# Patient Record
Sex: Female | Born: 1975 | Race: Black or African American | Hispanic: No | Marital: Single | Smoking: Never smoker
Health system: Southern US, Community
[De-identification: ages and names within clinical notes are randomized; demographics above are authoritative.]

## PROBLEM LIST (undated history)

## (undated) DIAGNOSIS — G43909 Migraine, unspecified, not intractable, without status migrainosus: Secondary | ICD-10-CM

## (undated) DIAGNOSIS — R42 Dizziness and giddiness: Secondary | ICD-10-CM

---

## 2016-01-19 ENCOUNTER — Emergency Department
Admission: EM | Admit: 2016-01-19 | Discharge: 2016-01-19 | Disposition: A | Discharge status: Home / Self Care | Payer: Worker's Compensation | Attending: Student | Admitting: Student

## 2016-01-19 ENCOUNTER — Encounter: Payer: Self-pay | Admitting: Emergency Medicine

## 2016-01-19 DIAGNOSIS — X58XXXA Exposure to other specified factors, initial encounter: Secondary | ICD-10-CM | POA: Insufficient documentation

## 2016-01-19 DIAGNOSIS — M545 Low back pain: Secondary | ICD-10-CM | POA: Diagnosis present

## 2016-01-19 DIAGNOSIS — Y990 Civilian activity done for income or pay: Secondary | ICD-10-CM | POA: Diagnosis not present

## 2016-01-19 DIAGNOSIS — Y9389 Activity, other specified: Secondary | ICD-10-CM | POA: Diagnosis not present

## 2016-01-19 DIAGNOSIS — S39012A Strain of muscle, fascia and tendon of lower back, initial encounter: Secondary | ICD-10-CM | POA: Diagnosis not present

## 2016-01-19 DIAGNOSIS — Y9289 Other specified places as the place of occurrence of the external cause: Secondary | ICD-10-CM | POA: Insufficient documentation

## 2016-01-19 MED ORDER — NAPROXEN 500 MG PO TABS: 500.0000 mg | ORAL_TABLET | Freq: Two times a day (BID) | ORAL | Status: DC

## 2016-01-19 MED ORDER — METHOCARBAMOL 500 MG PO TABS: 500.0000 mg | ORAL_TABLET | Freq: Four times a day (QID) | ORAL | Status: AC

## 2016-01-19 NOTE — ED Provider Notes (Signed)
[facility] Emergency Department Provider Note  ____________________________________________  Time seen: Approximately 5:28 PM  I have reviewed the triage vital signs and the nursing notes.   HISTORY  Chief Complaint Back Pain    HPI Holly GrillLisa Elizabeth Mason is a 40 y.o. female who presents emergency department complaining of right lower back pain 8 hours. Patient states that she was at work moving a very obese patient when she felt pain pulled/straining sensation in her lower back. Patient states that she has had constant pain in this region since this occurrence. She denies any radiation to midline, left side, her lower extremities. Patient states that the pain is sharp, constant, 8 out of 10. She is tried Tylenol 1 with only mild relief. No other injury or complaint at this time.   History reviewed. No pertinent past medical history.  There are no active problems to display for this patient.   History reviewed. No pertinent past surgical history.  Current Outpatient Rx  Name  Route  Sig  Dispense  Refill  . methocarbamol (ROBAXIN) 500 MG tablet   Oral   Take 1 tablet (500 mg total) by mouth 4 (four) times daily.   16 tablet   0   . naproxen (NAPROSYN) 500 MG tablet   Oral   Take 1 tablet (500 mg total) by mouth 2 (two) times daily with a meal.   60 tablet   0     Allergies Review of patient's allergies indicates no known allergies.  No family history on file.  Social History Social History  Substance Use Topics  . Smoking status: Never Smoker   . Smokeless tobacco: None  . Alcohol Use: No     Review of Systems  Constitutional: No fever/chills Cardiovascular: no chest pain. Respiratory: no cough. No SOB. Musculoskeletal: Positive for right lower back pain. Skin: Negative for rash, abrasions, lacerations, ecchymosis. Neurological: Negative for headaches, focal weakness or numbness. 10-point ROS otherwise  negative.  ____________________________________________   PHYSICAL EXAM:  VITAL SIGNS: ED Triage Vitals  Enc Vitals Group     BP 01/19/16 1623 124/81 mmHg     Pulse Rate 01/19/16 1623 92     Resp 01/19/16 1623 18     Temp 01/19/16 1623 98.7 F (37.1 C)     Temp Source 01/19/16 1623 Oral     SpO2 01/19/16 1623 100 %     Weight 01/19/16 1623 190 lb (86.183 kg)     Height 01/19/16 1623 5\' 5"  (1.651 m)     Head Cir --      Peak Flow --      Pain Score 01/19/16 1625 8     Pain Loc --      Pain Edu? --      Excl. in GC? --      Constitutional: Alert and oriented. Well appearing and in no acute distress. Eyes: Conjunctivae are normal. PERRL. EOMI. Head: Atraumatic. Cardiovascular: Normal rate, regular rhythm. Normal S1 and S2.  Good peripheral circulation. Respiratory: Normal respiratory effort without tachypnea or retractions. Lungs CTAB. Good air entry to the bases with no decreased or absent breath sounds. Gastrointestinal: Bowel sounds 4 quadrants. Soft and nontender to palpation. No guarding or rigidity. No palpable masses. No distention. No CVA tenderness. Musculoskeletal: Full range of motion to all extremities. No gross deformities appreciated. No deformity, ecchymosis, contusions noted to back but inspection. Patient is nontender to palpation midline spinal processes. Patient is diffusely tender to palpation in the lower thoracic  and throughout the lumbar paraspinal muscle groups. Spasms are appreciated. Full range of motion to spine. No tenderness to palpation over bilateral sciatic notches. Negative straight leg raise bilaterally. Dorsalis pedis pulses intact bilaterally. Sensation intact and equal lower extremities. Neurologic:  Normal speech and language. No gross focal neurologic deficits are appreciated.  Skin:  Skin is warm, dry and intact. No rash noted. Psychiatric: Mood and affect are normal. Speech and behavior are normal. Patient exhibits appropriate insight and  judgement.   ____________________________________________   LABS (all labs ordered are listed, but only abnormal results are displayed)  Labs Reviewed - No data to display ____________________________________________  EKG   ____________________________________________  RADIOLOGY   No results found.  ____________________________________________    PROCEDURES  Procedure(s) performed:       Medications - No data to display   ____________________________________________   INITIAL IMPRESSION / ASSESSMENT AND PLAN / ED COURSE  Pertinent labs & imaging results that were available during my care of the patient were reviewed by me and considered in my medical decision making (see chart for details).  Patient's diagnosis is consistent with lumbar paraspinal muscle strain. Exam is reassuring. No imaging is ordered at this time.. Patient will be discharged home with prescriptions for anti-inflammatories and muscle relaxers for symptom control. Patient is to follow up with primary care provider as needed or otherwise directed. Patient is given ED precautions to return to the ED for any worsening or new symptoms.     ____________________________________________  FINAL CLINICAL IMPRESSION(S) / ED DIAGNOSES  Final diagnoses:  Strain of lumbar paraspinal muscle, initial encounter      NEW MEDICATIONS STARTED DURING THIS VISIT:  Discharge Medication List as of 01/19/2016  5:29 PM    START taking these medications   Details  methocarbamol (ROBAXIN) 500 MG tablet Take 1 tablet (500 mg total) by mouth 4 (four) times daily., Starting 01/19/2016, Until Discontinued, Print    naproxen (NAPROSYN) 500 MG tablet Take 1 tablet (500 mg total) by mouth 2 (two) times daily with a meal., Starting 01/19/2016, Until Discontinued, Print            This chart was dictated using voice recognition software/Dragon. Despite best efforts to proofread, errors can occur which can change  the meaning. Any change was purely unintentional.    Racheal PatchesJonathan D Cuthriell, PA-C 01/19/16 1742  Gayla DossEryka A Gayle, MD 01/20/16 [phone]

## 2016-01-19 NOTE — ED Notes (Signed)
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 [employer].

## 2016-01-19 NOTE — Discharge Instructions (Signed)

## 2016-02-09 ENCOUNTER — Emergency Department
Admission: EM | Admit: 2016-02-09 | Discharge: 2016-02-09 | Disposition: A | Discharge status: Home / Self Care | Payer: Worker's Compensation | Attending: Emergency Medicine | Admitting: Emergency Medicine

## 2016-02-09 ENCOUNTER — Emergency Department: Payer: Worker's Compensation

## 2016-02-09 ENCOUNTER — Encounter: Payer: Self-pay | Admitting: Emergency Medicine

## 2016-02-09 DIAGNOSIS — Z79899 Other long term (current) drug therapy: Secondary | ICD-10-CM | POA: Insufficient documentation

## 2016-02-09 DIAGNOSIS — X509XXD Other and unspecified overexertion or strenuous movements or postures, subsequent encounter: Secondary | ICD-10-CM | POA: Insufficient documentation

## 2016-02-09 DIAGNOSIS — S29012D Strain of muscle and tendon of back wall of thorax, subsequent encounter: Secondary | ICD-10-CM | POA: Insufficient documentation

## 2016-02-09 DIAGNOSIS — M546 Pain in thoracic spine: Secondary | ICD-10-CM | POA: Diagnosis present

## 2016-02-09 HISTORY — DX: Dizziness and giddiness: R42

## 2016-02-09 HISTORY — DX: Migraine, unspecified, not intractable, without status migrainosus: G43.909

## 2016-02-09 LAB — POCT PREGNANCY, URINE: Preg Test, Ur: NEGATIVE

## 2016-02-09 MED ORDER — MELOXICAM 15 MG PO TABS: 15.0000 mg | ORAL_TABLET | Freq: Every day | ORAL | Status: AC

## 2016-02-09 MED ORDER — LIDOCAINE 5 % EX PTCH: 1.0000 | MEDICATED_PATCH | Freq: Two times a day (BID) | CUTANEOUS | Status: AC

## 2016-02-09 NOTE — ED Provider Notes (Signed)
[facility] Emergency Department Provider Note  ____________________________________________  Time seen: Approximately 11:14 PM  I have reviewed the triage vital signs and the nursing notes.   HISTORY  Chief Complaint Back Pain    HPI Holly Mason is a 40 y.o. female who returns to emergency department for complaint of back pain. 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 Patient's exam was reassuring at the time and she was placed on muscle relaxers and anti-inflammatories. She has been followed as a work comp case by [facility] urgent care. She is continuing to wait on a physical therapy referral for further treatment. Patient states that the pain increased tonight and she presents to the emergency department with severe back pain. Patient denies any new injury. She is on work restrictions to avoid pulling, pushing, lifting any weight over 5 pounds, repetitive motions with her back. She has been taking her anti-inflammatories and muscle relaxers as prescribed with intermittent improvement. Patient denies any bowel or bladder dysfunction, saddle anesthesia, paresthesias. No urinary symptoms. No abdominal symptoms.   Past Medical History  Diagnosis Date  . Vertigo   . Migraines     There are no active problems to display for this patient.   No past surgical history on file.  Current Outpatient Rx  Name  Route  Sig  Dispense  Refill  . lidocaine (LIDODERM) 5 %   Transdermal   Place 1 patch onto the skin every 12 (twelve) hours. Remove & Discard patch within 12 hours or as directed by MD   20 patch   0   . meloxicam (MOBIC) 15 MG tablet   Oral   Take 1 tablet (15 mg total) by mouth daily.   30 tablet   0   . methocarbamol (ROBAXIN) 500 MG tablet   Oral   Take 1 tablet (500 mg total) by mouth 4 (four) times daily.   16 tablet   0     Allergies Review of patient's  allergies indicates no known allergies.  No family history on file.  Social History Social History  Substance Use Topics  . Smoking status: Never Smoker   . Smokeless tobacco: Never Used  . Alcohol Use: No     Review of Systems  Constitutional: No fever/chills Cardiovascular: no chest pain. Respiratory: no cough. No SOB. Gastrointestinal: No abdominal pain.  No nausea, no vomiting.  No diarrhea.  No constipation. Genitourinary: Negative for dysuria. No hematuria Musculoskeletal: Positive for thoracic back pain Skin: Negative for rash, abrasions, lacerations, ecchymosis. Neurological: Negative for headaches, focal weakness or numbness. 10-point ROS otherwise negative.  ____________________________________________   PHYSICAL EXAM:  VITAL SIGNS: ED Triage Vitals  Enc Vitals Group     BP 02/09/16 2037 160/90 mmHg     Pulse Rate 02/09/16 2037 72     Resp 02/09/16 2037 18     Temp 02/09/16 2037 98.9 F (37.2 C)     Temp Source 02/09/16 2037 Oral     SpO2 02/09/16 2037 100 %     Weight 02/09/16 2037 198 lb (89.812 kg)     Height 02/09/16 2037 5\' 4"  (1.626 m)     Head Cir --      Peak Flow --      Pain Score 02/09/16 2038 10     Pain Loc --      Pain Edu? --      Excl. in GC? --  Constitutional: Alert and oriented. Well appearing and in no acute distress. Eyes: Conjunctivae are normal. PERRL. EOMI. Head: Atraumatic. Neck: No stridor.  No cervical spine tenderness to palpation.  Cardiovascular: Normal rate, regular rhythm. Normal S1 and S2.  Good peripheral circulation. Respiratory: Normal respiratory effort without tachypnea or retractions. Lungs CTAB. Good air entry to the bases with no decreased or absent breath sounds. Gastrointestinal: Bowel sounds 4 quadrants. Soft and nontender to palpation. No guarding or rigidity. No palpable masses. No distention. No CVA tenderness. Musculoskeletal: Full range of motion to all extremities. No gross deformities  appreciated.She is diffusely tender to palpation over the thoracic paraspinal muscle groups. No midline tenderness to palpation. No palpable abnormality. Patient is good range of motion to her spine. Patient has good sensation in bilateral lower extremities. Dorsalis pedis pulse intact bilateral lower extremities. Neurologic:  Normal speech and language. No gross focal neurologic deficits are appreciated.  Skin:  Skin is warm, dry and intact. No rash noted. Psychiatric: Mood and affect are normal. Speech and behavior are normal. Patient exhibits appropriate insight and judgement.   ____________________________________________   LABS (all labs ordered are listed, but only abnormal results are displayed)  Labs Reviewed  POC URINE PREG, ED  POCT PREGNANCY, URINE   ____________________________________________  EKG   ____________________________________________  RADIOLOGY Festus Barren Cuthriell, personally viewed and evaluated these images (plain radiographs) as part of my medical decision making, as well as reviewing the written report by the radiologist.  Dg Thoracic Spine 2 View  02/09/2016  CLINICAL DATA:  Back injury on 01/19/2016. Increasing pain. Pain midline between shoulder blades. EXAM: THORACIC SPINE 2 VIEWS COMPARISON:  None. FINDINGS: There is no evidence of thoracic spine fracture. Alignment is normal. No other significant bone abnormalities are identified. Mild endplate osteophytes at mid thoracic region. IMPRESSION: Negative. Electronically Signed   By: Burman Nieves M.D.   On: 02/09/2016 23:08   Dg Lumbar Spine Complete  02/09/2016  CLINICAL DATA:  Back injury on 01/19/2016.  Increasing pain. EXAM: LUMBAR SPINE - COMPLETE 4+ VIEW COMPARISON:  None. FINDINGS: There is no evidence of lumbar spine fracture. Alignment is normal. Intervertebral disc spaces are maintained. Intrauterine device is present. IMPRESSION: Negative. Electronically Signed   By: Burman Nieves M.D.    On: 02/09/2016 23:09    ____________________________________________    PROCEDURES  Procedure(s) performed:       Medications - No data to display   ____________________________________________   INITIAL IMPRESSION / ASSESSMENT AND PLAN / ED COURSE  Pertinent labs & imaging results that were available during my care of the patient were reviewed by me and considered in my medical decision making (see chart for details).  Patient's diagnosis is consistent with paraspinal muscle spasms. This continues from her previous visit. She is being followed by urgent care and awaiting referral for physical therapy. Patient's medications will be changed from Naprosyn to Motrin. She is to continue taking her muscle relaxer as directed. Patient is also given Lidoderm patches for symptom control. She will await physical therapy referral for further treatment of her condition..  Patient is given ED precautions to return to the ED for any worsening or new symptoms.     ____________________________________________  FINAL CLINICAL IMPRESSION(S) / ED DIAGNOSES  Final diagnoses:  Strain of thoracic paraspinal muscles excluding T1 and T2 levels, subsequent encounter      NEW MEDICATIONS STARTED DURING THIS VISIT:  New Prescriptions   LIDOCAINE (LIDODERM) 5 %    Place 1 patch  onto the skin every 12 (twelve) hours. Remove & Discard patch within 12 hours or as directed by MD   MELOXICAM (MOBIC) 15 MG TABLET    Take 1 tablet (15 mg total) by mouth daily.        This chart was dictated using voice recognition software/Dragon. Despite best efforts to proofread, errors can occur which can change the meaning. Any change was purely unintentional.    Racheal Patches, PA-C 02/09/16 2334  Myrna Blazer, MD 02/10/16 [phone]

## 2016-02-09 NOTE — Discharge Instructions (Signed)

## 2016-02-09 NOTE — ED Notes (Signed)
Pt states on 06/24 injured self at work- [employer], pt complains of mid back pain. Pt states today had increasing mid back pain 1000/10 with movement. Pt with cms intact in all extremities. Pt denies loss of bowel or bladder, denies known fever. Pt appears in no acute distress in triage, see profile for uds need, unable to contact anyone at [employer] at this time regarding drug screen need.

## 2016-02-09 NOTE — ED Notes (Addendum)
This is a workers comp case that she has been seen at [facility] which is who she is suppose to follow up with. On muscle relaxer since but states no improvement. Has been recommended for pt but is waiting for approval.

## 2016-04-15 ENCOUNTER — Other Ambulatory Visit: Payer: Self-pay | Admitting: Specialist

## 2016-04-15 DIAGNOSIS — S335XXD Sprain of ligaments of lumbar spine, subsequent encounter: Secondary | ICD-10-CM

## 2016-04-23 ENCOUNTER — Ambulatory Visit
Admission: RE | Admit: 2016-04-23 | Discharge: 2016-04-23 | Disposition: A | Discharge status: Home / Self Care | Payer: Worker's Compensation | Source: Ambulatory Visit | Attending: Specialist | Admitting: Specialist

## 2016-04-23 ENCOUNTER — Other Ambulatory Visit: Payer: Self-pay | Admitting: Specialist

## 2016-04-23 DIAGNOSIS — S335XXD Sprain of ligaments of lumbar spine, subsequent encounter: Secondary | ICD-10-CM

## 2016-04-23 MED ORDER — METHYLPREDNISOLONE ACETATE 40 MG/ML INJ SUSP (RADIOLOG: 120.0000 mg | Freq: Once | INTRAMUSCULAR | Status: DC

## 2016-04-23 MED ORDER — IOPAMIDOL (ISOVUE-M 200) INJECTION 41%: 1.0000 mL | Freq: Once | INTRAMUSCULAR | Status: DC

## 2016-04-23 NOTE — Discharge Instructions (Signed)

## 2017-03-11 IMAGING — CR DG LUMBAR SPINE COMPLETE 4+V
5 series · 5 of 5 positions shown · non-contrast
Comparison: None.

CLINICAL DATA: Back injury on 01/19/2016.  Increasing pain.

EXAM:
LUMBAR SPINE - COMPLETE 4+ VIEW

[l-spine ap]
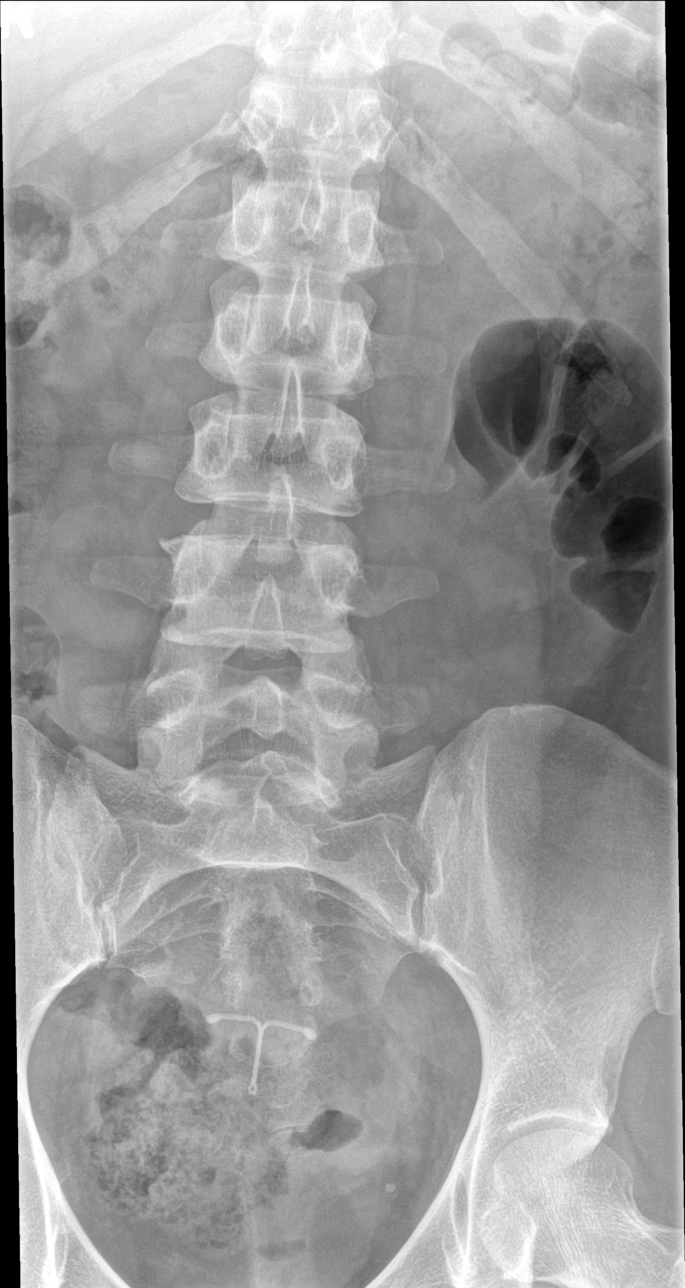

[l-spine obl (1 of 2)]
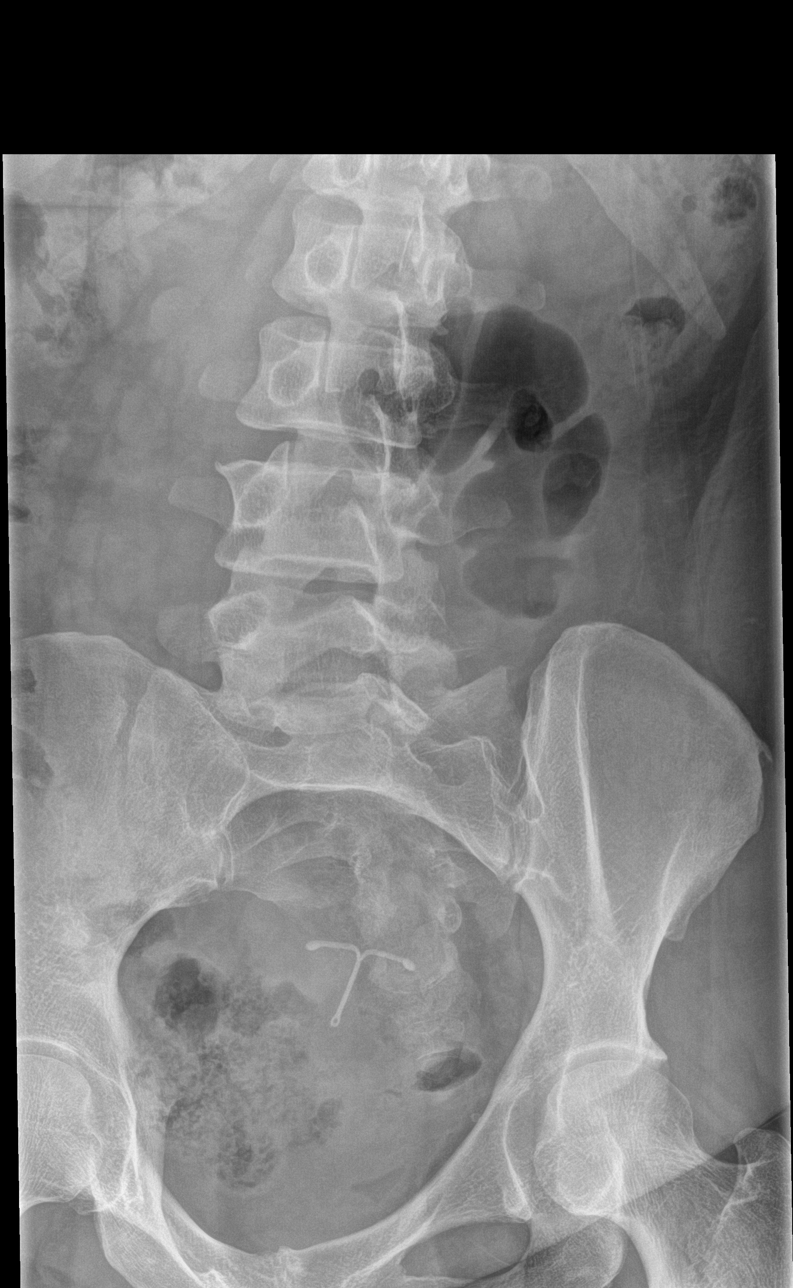

[l-spine obl (2 of 2)]
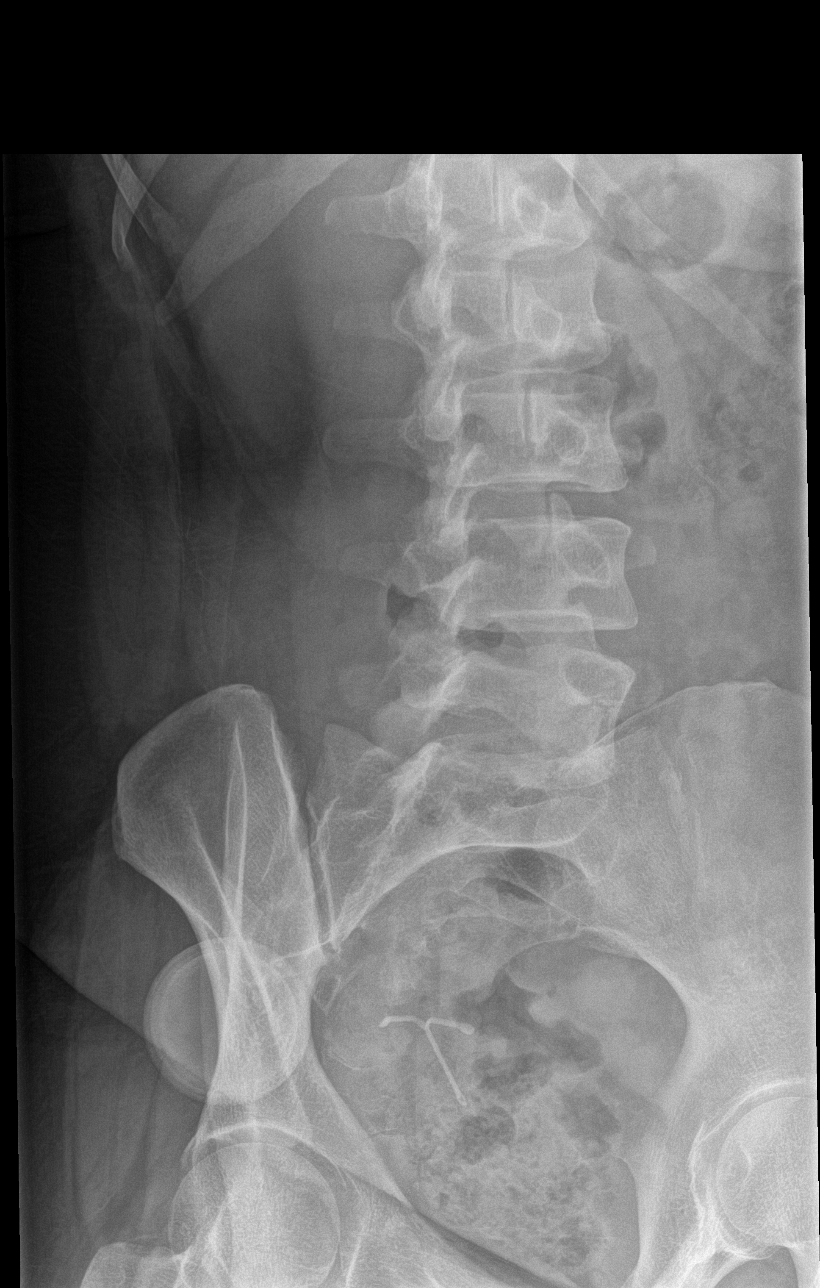

[l-spine lat]
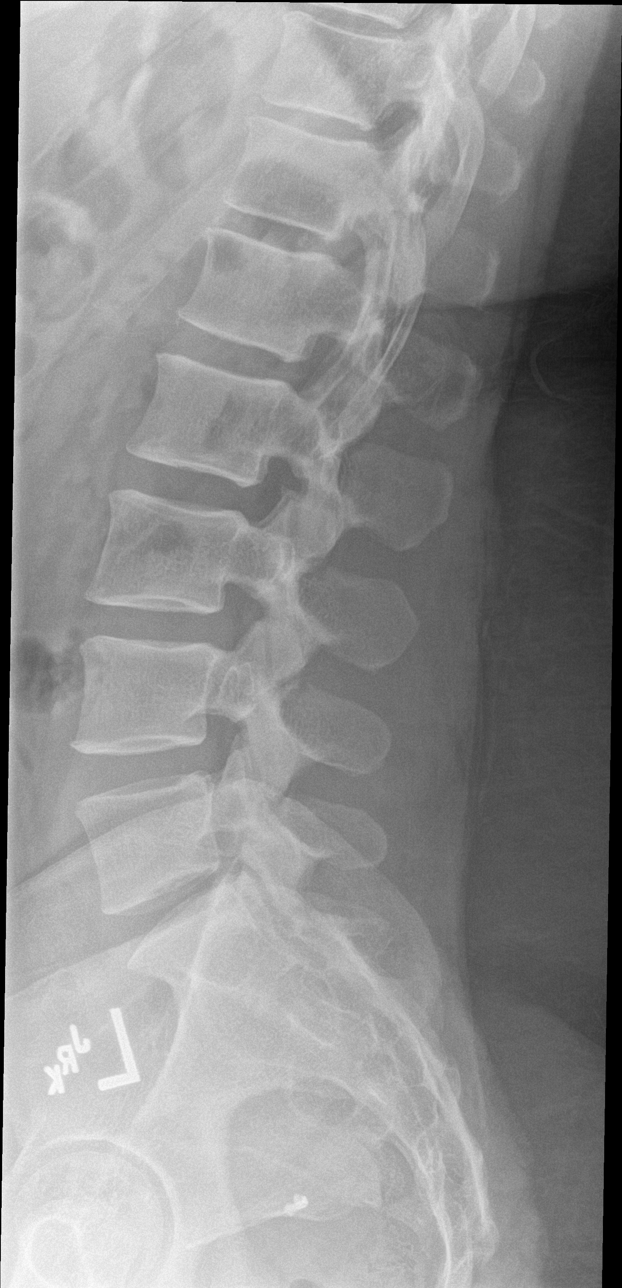

[l-spine spot]
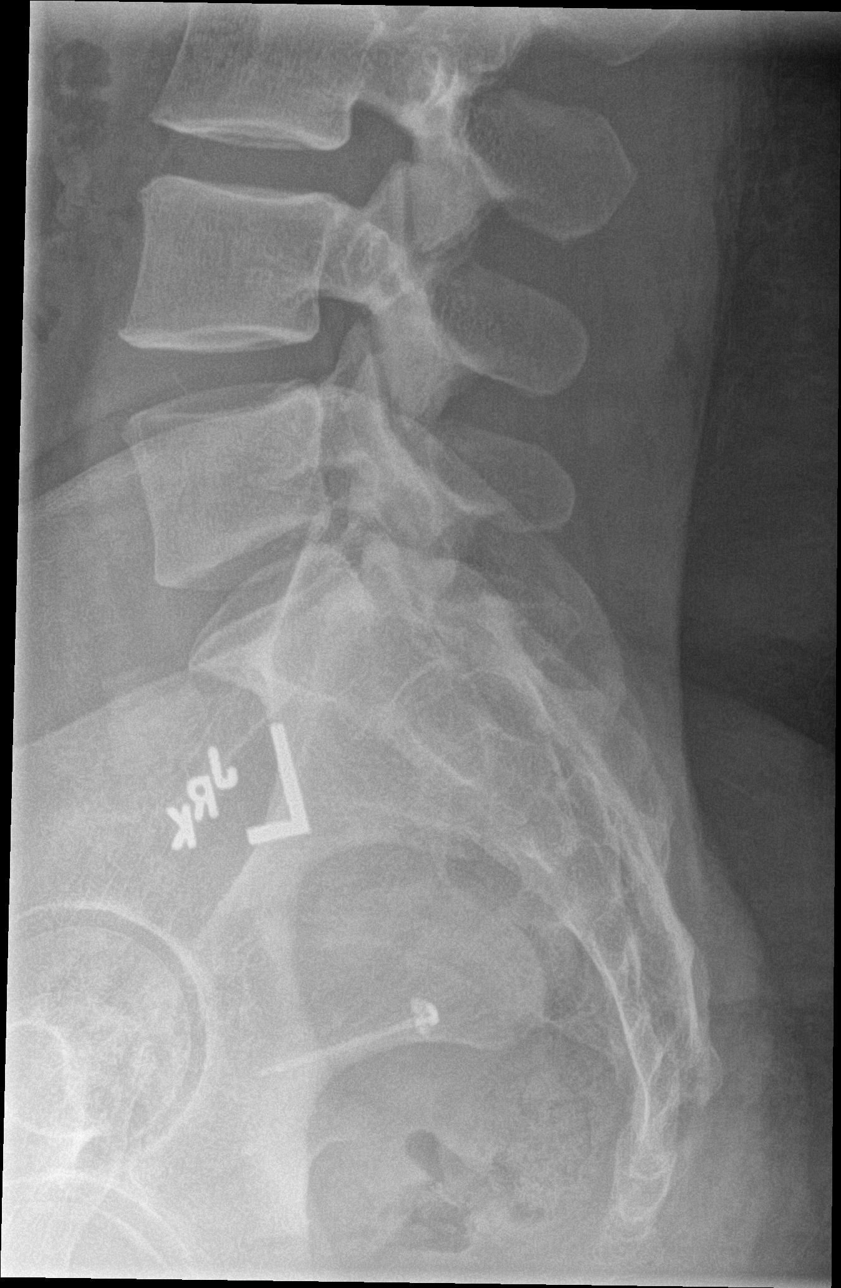

[5 of 5 positions shown; findings below may reference images not displayed]

FINDINGS: There is no evidence of lumbar spine fracture. Alignment is normal.
Intervertebral disc spaces are maintained. Intrauterine device is
present.
IMPRESSION: Negative.

## 2017-03-11 IMAGING — CR DG THORACIC SPINE 2V
3 series · 3 of 3 positions shown · non-contrast
Comparison: None.

CLINICAL DATA: Back injury on 01/19/2016. Increasing pain. Pain
midline between shoulder blades.

EXAM:
THORACIC SPINE 2 VIEWS

[t-spine ap]
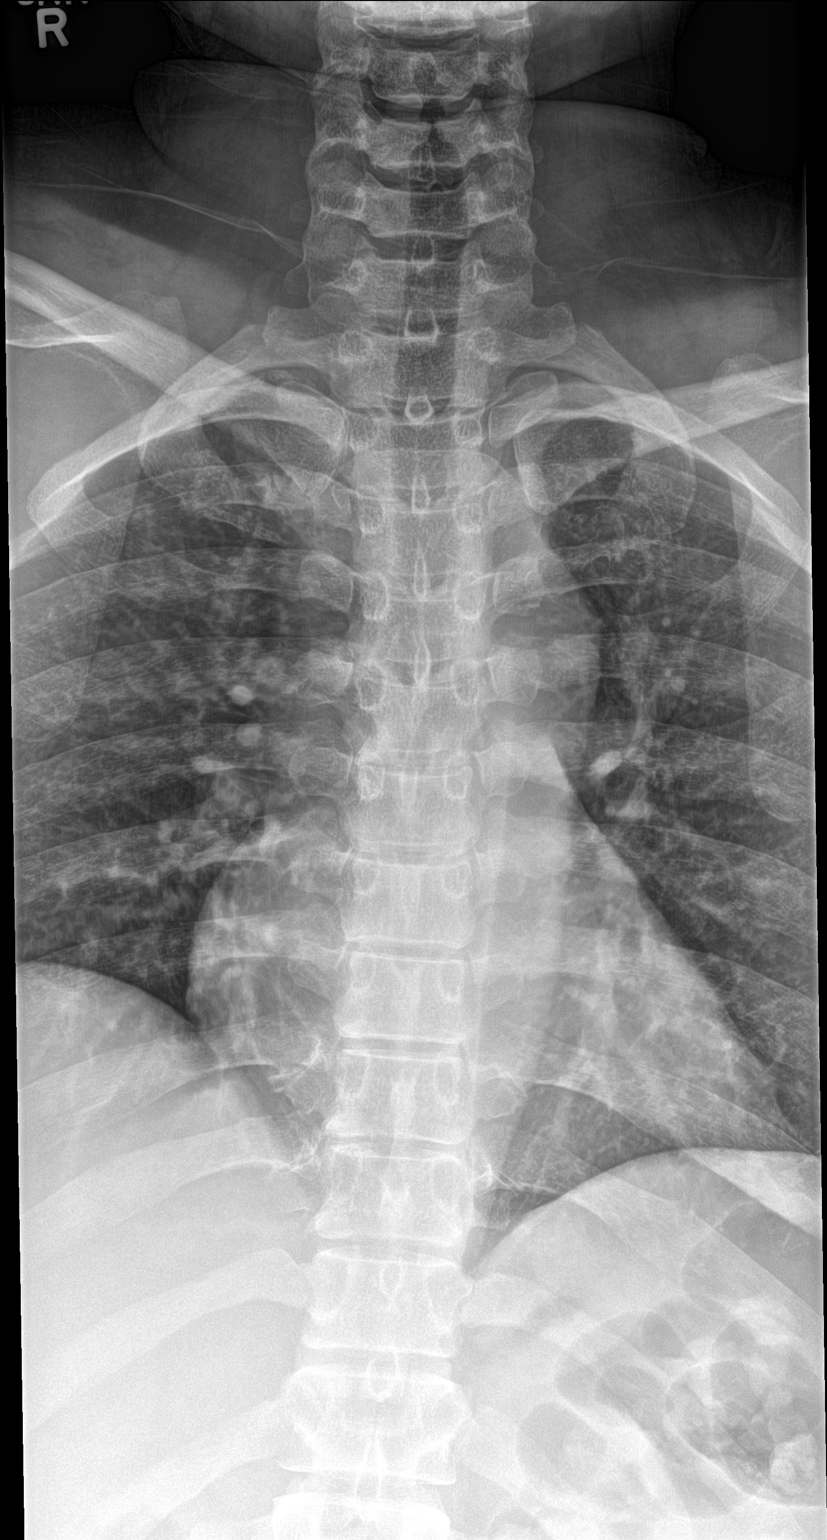

[t-spine lat]
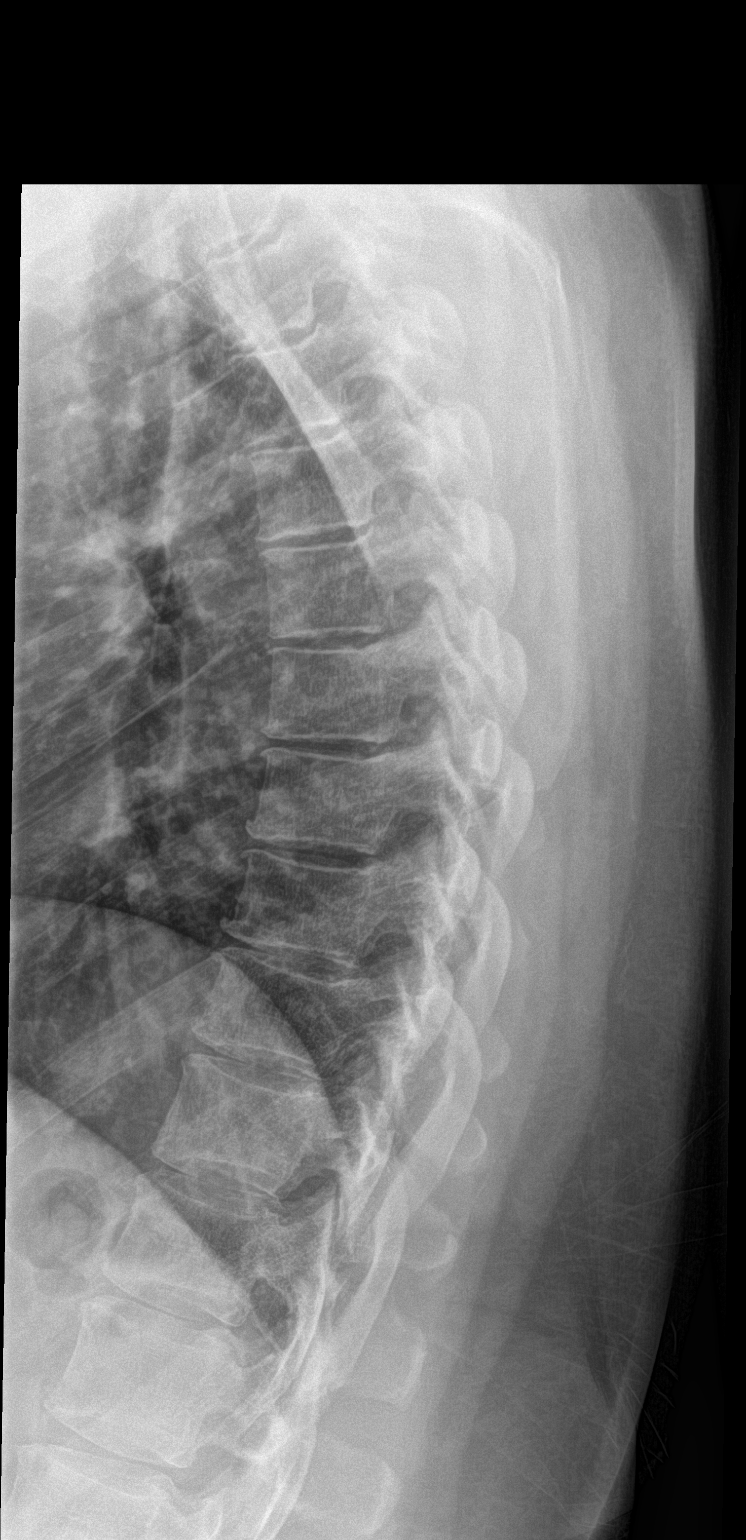

[t-spine swimmers]
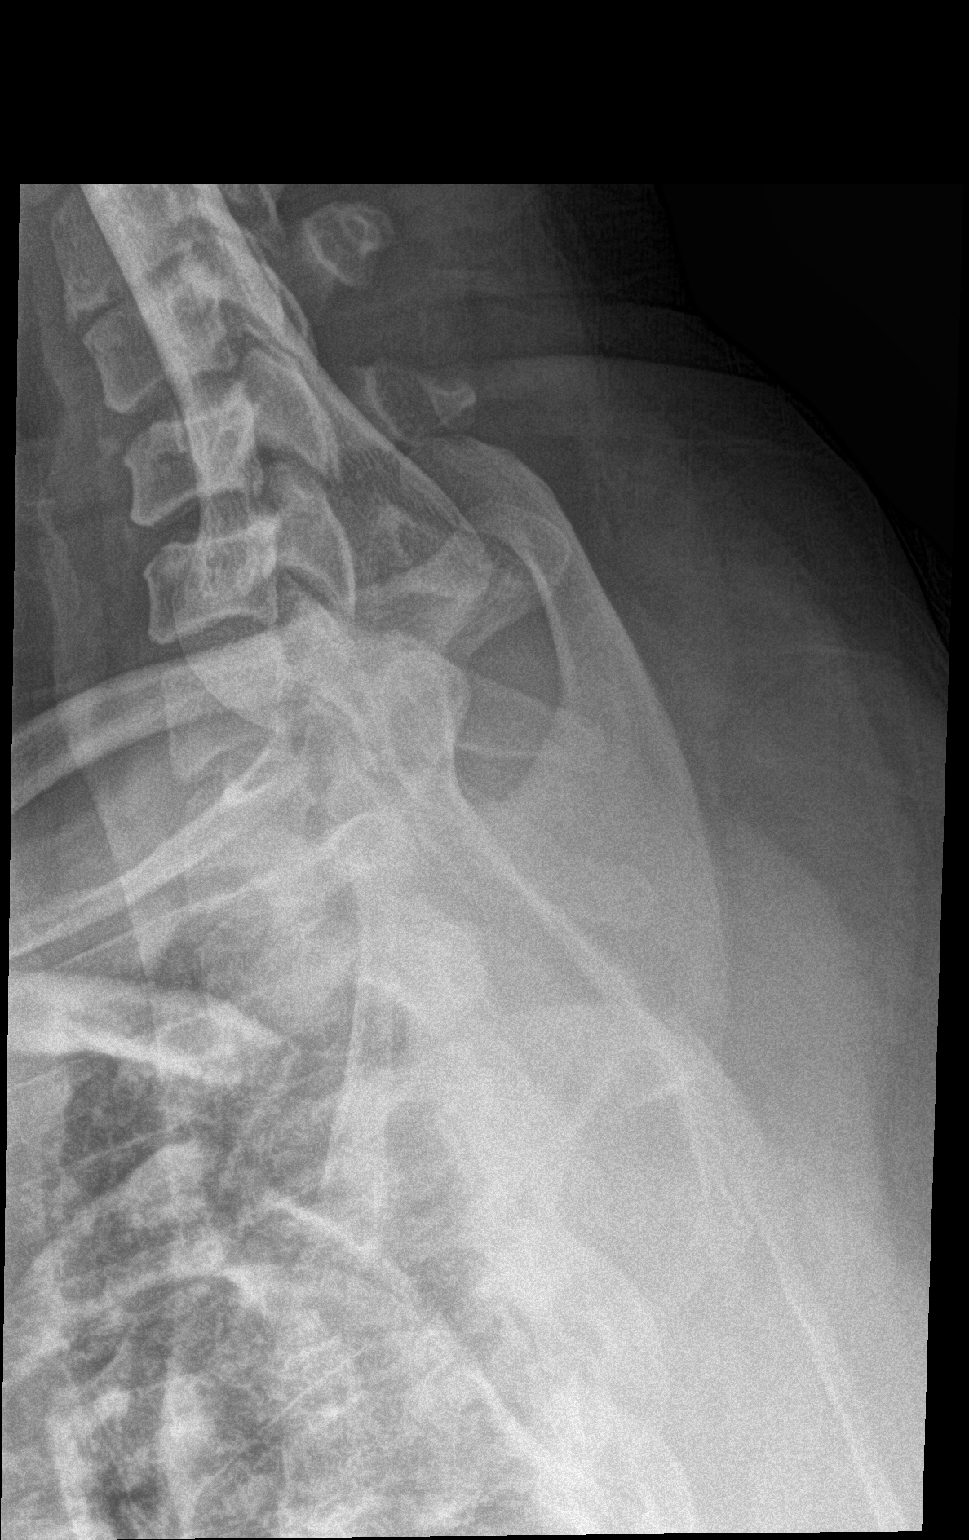

[3 of 3 positions shown; findings below may reference images not displayed]

FINDINGS: There is no evidence of thoracic spine fracture. Alignment is
normal. No other significant bone abnormalities are identified. Mild
endplate osteophytes at mid thoracic region.
IMPRESSION: Negative.

## 2017-05-29 ENCOUNTER — Emergency Department
Admission: EM | Admit: 2017-05-29 | Discharge: 2017-05-29 | Disposition: A | Discharge status: Home / Self Care | Payer: BLUE CROSS/BLUE SHIELD | Attending: Emergency Medicine | Admitting: Emergency Medicine

## 2017-05-29 ENCOUNTER — Encounter: Payer: Self-pay | Admitting: Emergency Medicine

## 2017-05-29 DIAGNOSIS — H9201 Otalgia, right ear: Secondary | ICD-10-CM

## 2017-05-29 DIAGNOSIS — J0100 Acute maxillary sinusitis, unspecified: Secondary | ICD-10-CM | POA: Diagnosis not present

## 2017-05-29 DIAGNOSIS — Z79899 Other long term (current) drug therapy: Secondary | ICD-10-CM | POA: Diagnosis not present

## 2017-05-29 MED ORDER — NAPROXEN 500 MG PO TABS: 500.0000 mg | ORAL_TABLET | Freq: Two times a day (BID) | ORAL | Status: AC

## 2017-05-29 MED ORDER — AMOXICILLIN-POT CLAVULANATE 875-125 MG PO TABS: 1.0000 | ORAL_TABLET | Freq: Two times a day (BID) | ORAL | 0 refills | Status: AC

## 2017-05-29 MED ORDER — FEXOFENADINE-PSEUDOEPHED ER 60-120 MG PO TB12: 1.0000 | ORAL_TABLET | Freq: Two times a day (BID) | ORAL | 0 refills | Status: AC

## 2017-05-29 NOTE — ED Notes (Signed)
NAD noted at time of D/C. Pt denies questions or concerns. Pt ambulatory to the lobby at this time.  

## 2017-05-29 NOTE — ED Provider Notes (Signed)
[facility] Emergency Department Provider Note   ____________________________________________   First MD Initiated Contact with Patient 05/29/17 1018     (approximate)  I have reviewed the triage vital signs and the nursing notes.   HISTORY  Chief Complaint Otalgia and Facial Pain     HPI Holly Mason is a 41 y.o. female patient complaining of right ear pain for 2 days. Patient also state pain behind the right eye. Patient state nasal congestion and right facial pain for 7-10 days. Patient stated intermittent fever and chills. Patient denies cough with this complaint. Patient denies nausea vomiting diarrhea.Patient rates the pain as 8/10. Patient describes the pain as "pressure". No relief with over-the-counter medications.   Past Medical History:  Diagnosis Date  . Migraines   . Vertigo     There are no active problems to display for this patient.   History reviewed. No pertinent surgical history.  Prior to Admission medications   Medication Sig Start Date End Date Taking? Authorizing Provider  amoxicillin-clavulanate (AUGMENTIN) 875-125 MG tablet Take 1 tablet by mouth 2 (two) times daily. 05/29/17 06/08/17  Joni Reining, PA-C  fexofenadine-pseudoephedrine (ALLEGRA-D) 60-120 MG 12 hr tablet Take 1 tablet by mouth 2 (two) times daily. 05/29/17   Joni Reining, PA-C  lidocaine (LIDODERM) 5 % Place 1 patch onto the skin every 12 (twelve) hours. Remove & Discard patch within 12 hours or as directed by MD 02/09/16   Cuthriell, Delorise Royals, PA-C  meloxicam (MOBIC) 15 MG tablet Take 1 tablet (15 mg total) by mouth daily. 02/09/16   Cuthriell, Delorise Royals, PA-C  methocarbamol (ROBAXIN) 500 MG tablet Take 1 tablet (500 mg total) by mouth 4 (four) times daily. 01/19/16   Cuthriell, Delorise Royals, PA-C  naproxen (NAPROSYN) 500 MG tablet Take 1 tablet (500 mg total) by mouth 2 (two) times daily with a meal. 05/29/17   Joni Reining, PA-C     Allergies Patient has no known allergies.  History reviewed. No pertinent family history.  Social History Social History  Substance Use Topics  . Smoking status: Never Smoker  . Smokeless tobacco: Never Used  . Alcohol use No    Review of Systems  Constitutional: No fever/chills Eyes: No visual changes. ENT: No sore throat. Nasal congestion and sinus pressure. Right ear pressure/pain. Cardiovascular: Denies chest pain. Respiratory: Denies shortness of breath. Gastrointestinal: No abdominal pain.  No nausea, no vomiting.  No diarrhea.  No constipation. Genitourinary: Negative for dysuria. Musculoskeletal: Negative for back pain. Skin: Negative for rash. Neurological: Negative for headaches, focal weakness or numbness. Last one was on a scheduled  ____________________________________________   PHYSICAL EXAM:  VITAL SIGNS: ED Triage Vitals  Enc Vitals Group     BP --      Pulse --      Resp --      Temp --      Temp src --      SpO2 --      Weight 05/29/17 0953 195 lb (88.5 kg)     Height 05/29/17 0953 5\' 4"  (1.626 m)     Head Circumference --      Peak Flow --      Pain Score 05/29/17 0952 8     Pain Loc --      Pain Edu? --      Excl. in GC? --    Constitutional: Alert and oriented. Well appearing and in no acute distress. Nose: Edematous nasal turbinates with  right maxillary guarding. Edematous non-erythematous right TM. Mouth/Throat: Mucous membranes are moist.  Oropharynx non-erythematous. Cardiovascular: Normal rate, regular rhythm. Grossly normal heart sounds.  Good peripheral circulation. Respiratory: Normal respiratory effort.  No retractions. Lungs CTAB. Neurologic:  Normal speech and language. No gross focal neurologic deficits are appreciated. No gait instability. Skin:  Skin is warm, dry and intact. No rash noted. Psychiatric: Mood and affect are normal. Speech and behavior are normal.  ____________________________________________    LABS (all labs ordered are listed, but only abnormal results are displayed)  Labs Reviewed - No data to display ____________________________________________  EKG   ____________________________________________  RADIOLOGY  No results found.  ____________________________________________   PROCEDURES  Procedure(s) performed: None  Procedures  Critical Care performed: No  ____________________________________________   INITIAL IMPRESSION / ASSESSMENT AND PLAN / ED COURSE  As part of my medical decision making, I reviewed the following data within the electronic MEDICAL RECORD NUMBER    right ear facial pain secondary to right maxillary sinusitis. Patient given discharge care instruction prostate medication as directed. Patient advised to [facility] with [facility] condition persists. Patient given a work note for today.    FINAL CLINICAL IMPRESSION(S) / ED DIAGNOSES  Final diagnoses:  Subacute maxillary sinusitis  Otalgia of right ear      NEW MEDICATIONS STARTED DURING THIS VISIT:  New Prescriptions   AMOXICILLIN-CLAVULANATE (AUGMENTIN) 875-125 MG TABLET    Take 1 tablet by mouth 2 (two) times daily.   FEXOFENADINE-PSEUDOEPHEDRINE (ALLEGRA-D) 60-120 MG 12 HR TABLET    Take 1 tablet by mouth 2 (two) times daily.   NAPROXEN (NAPROSYN) 500 MG TABLET    Take 1 tablet (500 mg total) by mouth 2 (two) times daily with a meal.     Note:  This document was prepared using Dragon voice recognition software and may include unintentional dictation errors.    Joni ReiningSmith, Ronald K, PA-C 05/29/17 1035    Schaevitz, Myra Rudeavid Matthew, MD 05/29/17 1046

## 2017-05-29 NOTE — ED Notes (Signed)
Pt c/o R ear pain x 1 week that has progressively worsened. Pt also c/o R eye pain and R facial pain x 2 days that has progressived worsened. Pt denies N/V, denies photosensitivity, sound sensitivity. Pt is neurologically intact at this time.

## 2017-05-29 NOTE — ED Triage Notes (Signed)
Ok for flex per [place].

## 2017-05-29 NOTE — ED Triage Notes (Signed)
Pt c/o right otalgia.  Has been X 2 days.  Also c/o pain behind right eye.  No vision changes. Ambulatory.

## 2017-08-19 ENCOUNTER — Encounter: Payer: Self-pay | Admitting: Intensive Care

## 2017-08-19 ENCOUNTER — Emergency Department
Admission: EM | Admit: 2017-08-19 | Discharge: 2017-08-19 | Disposition: A | Discharge status: Home / Self Care | Payer: BLUE CROSS/BLUE SHIELD | Attending: Emergency Medicine | Admitting: Emergency Medicine

## 2017-08-19 DIAGNOSIS — J111 Influenza due to unidentified influenza virus with other respiratory manifestations: Secondary | ICD-10-CM | POA: Insufficient documentation

## 2017-08-19 DIAGNOSIS — Z79899 Other long term (current) drug therapy: Secondary | ICD-10-CM | POA: Insufficient documentation

## 2017-08-19 DIAGNOSIS — R0981 Nasal congestion: Secondary | ICD-10-CM | POA: Diagnosis present

## 2017-08-19 DIAGNOSIS — R6889 Other general symptoms and signs: Secondary | ICD-10-CM

## 2017-08-19 LAB — INFLUENZA PANEL BY PCR (TYPE A & B)
Influenza A By PCR: NEGATIVE
Influenza B By PCR: NEGATIVE

## 2017-08-19 MED ORDER — PSEUDOEPH-BROMPHEN-DM 30-2-10 MG/5ML PO SYRP: 5.0000 mL | ORAL_SOLUTION | Freq: Four times a day (QID) | ORAL | 0 refills | Status: AC | PRN

## 2017-08-19 MED ORDER — IBUPROFEN 800 MG PO TABS: 800.0000 mg | ORAL_TABLET | Freq: Three times a day (TID) | ORAL | 0 refills | Status: AC | PRN

## 2017-08-19 NOTE — ED Notes (Signed)
See triage note. States she developed frontal headache and pressure couple of days ago. subjective fever  And has had chills/ hot flashes  afebrile on arrival

## 2017-08-19 NOTE — ED Provider Notes (Signed)
[facility] Emergency Department Provider Note   ____________________________________________   First MD Initiated Contact with Patient 08/19/17 [phone]     (approximate)  I have reviewed the triage vital signs and the nursing notes.   HISTORY  Chief Complaint Influenza and URI    HPI Holly Mason is a 42 y.o. female patient complain of body aches, chills/sweats, right ear pressure nasal and facial congestion for 3 days.  She also has diarrhea which started on Monday.  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  Patient describes her pain as "achy/pressure.  Patient rates pain as 8/10.  No palliative measure for complaint.  Past Medical History:  Diagnosis Date  . Migraines   . Vertigo     There are no active problems to display for this patient.   History reviewed. No pertinent surgical history.  Prior to Admission medications   Medication Sig Start Date End Date Taking? Authorizing Provider  brompheniramine-pseudoephedrine-DM 30-2-10 MG/5ML syrup Take 5 mLs by mouth 4 (four) times daily as needed. 08/19/17   Joni ReiningSmith, Welford Christmas K, PA-C  fexofenadine-pseudoephedrine (ALLEGRA-D) 60-120 MG 12 hr tablet Take 1 tablet by mouth 2 (two) times daily. 05/29/17   Joni ReiningSmith, Azaryah Oleksy K, PA-C  ibuprofen (ADVIL,MOTRIN) 800 MG tablet Take 1 tablet (800 mg total) by mouth every 8 (eight) hours as needed for moderate pain. 08/19/17   Joni ReiningSmith, Sonia Bromell K, PA-C  lidocaine (LIDODERM) 5 % Place 1 patch onto the skin every 12 (twelve) hours. Remove & Discard patch within 12 hours or as directed by MD 02/09/16   Cuthriell, Delorise RoyalsJonathan D, PA-C  meloxicam (MOBIC) 15 MG tablet Take 1 tablet (15 mg total) by mouth daily. 02/09/16   Cuthriell, Delorise RoyalsJonathan D, PA-C  methocarbamol (ROBAXIN) 500 MG tablet Take 1 tablet (500 mg total) by mouth 4 (four) times daily. 01/19/16   Cuthriell, Delorise RoyalsJonathan D, PA-C  naproxen (NAPROSYN) 500 MG tablet Take 1 tablet (500 mg total) by  mouth 2 (two) times daily with a meal. 05/29/17   Joni ReiningSmith, Lutricia Widjaja K, PA-C    Allergies Patient has no known allergies.  History reviewed. No pertinent family history.  Social History Social History   Tobacco Use  . Smoking status: Never Smoker  . Smokeless tobacco: Never Used  Substance Use Topics  . Alcohol use: Yes    Comment: occ  . Drug use: No    Review of Systems Constitutional: No fever/chills.  Radiates Eyes: No visual changes. ENT: No sore throat.  Nasal congestion and facial pain.  Right ear pressure Cardiovascular: Denies chest pain. Respiratory: Denies shortness of breath. Gastrointestinal: No abdominal pain.  No nausea, no vomiting.  No diarrhea.  No constipation. Genitourinary: Negative for dysuria. Musculoskeletal: Negative for back pain. Skin: Negative for rash. Neurological: Negative for headaches, focal weakness or numbness.   ____________________________________________   PHYSICAL EXAM:  VITAL SIGNS: ED Triage Vitals  Enc Vitals Group     BP 08/19/17 0830 129/72     Pulse Rate 08/19/17 0830 84     Resp 08/19/17 0830 16     Temp 08/19/17 0830 98.2 F (36.8 C)     Temp Source 08/19/17 0830 Oral     SpO2 08/19/17 0830 100 %     Weight 08/19/17 0829 194 lb (88 kg)     Height 08/19/17 0829 5\' 4"  (1.626 m)     Head Circumference --      Peak Flow --  Pain Score 08/19/17 0829 8     Pain Loc --      Pain Edu? --      Excl. in GC? --    Constitutional: Alert and oriented. Well appearing and in no acute distress. Eyes: Conjunctivae are normal. PERRL. EOMI. Head: Atraumatic. Nose: Edematous nasal turbinates with clear rhinorrhea.  Bilateral maxillary guarding.  Edematous right TM. Mouth/Throat: Mucous membranes are moist.  Oropharynx non-erythematous.  Postnasal drainage Neck: No stridor.   Cardiovascular: Normal rate, regular rhythm. Grossly normal heart sounds.  Good peripheral circulation. Respiratory: Normal respiratory effort.  No  retractions. Lungs CTAB. Skin:  Skin is warm, dry and intact. No rash noted. Psychiatric: Mood and affect are normal. Speech and behavior are normal.  ____________________________________________   LABS (all labs ordered are listed, but only abnormal results are displayed)  Labs Reviewed  INFLUENZA PANEL BY PCR (TYPE A & B)   ____________________________________________  EKG   ____________________________________________  RADIOLOGY  No results found.  ____________________________________________   PROCEDURES  Procedure(s) performed: None  Procedures  Critical Care performed: No  ____________________________________________   INITIAL IMPRESSION / ASSESSMENT AND PLAN / ED COURSE  As part of my medical decision making, I reviewed the following data within the electronic MEDICAL RECORD NUMBER    Viral respiratory infection.  Discussed negative flu results with patient.  Patient given discharge care instruction.  Patient on a work note and advised [facility] condition persists.      ____________________________________________   FINAL CLINICAL IMPRESSION(S) / ED DIAGNOSES  Final diagnoses:  Flu-like symptoms     ED Discharge Orders        Ordered    brompheniramine-pseudoephedrine-DM 30-2-10 MG/5ML syrup  4 times daily PRN     08/19/17 1034    ibuprofen (ADVIL,MOTRIN) 800 MG tablet  Every 8 hours PRN     08/19/17 1034       Note:  This document was prepared using Dragon voice recognition software and may include unintentional dictation errors.    Joni Reining, PA-C 08/19/17 1036    Rockne Menghini, MD 08/19/17 1550

## 2017-08-19 NOTE — ED Triage Notes (Signed)
Patient c/o body aches, chills/sweats, pressure in R ear and congestion since Sunday. Reports diarrhea on Sunday. 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 No respiratory distress in triage. Pressure in right ear

## 2017-09-02 ENCOUNTER — Encounter: Payer: Self-pay | Admitting: Emergency Medicine

## 2017-09-02 ENCOUNTER — Emergency Department: Payer: BLUE CROSS/BLUE SHIELD

## 2017-09-02 ENCOUNTER — Emergency Department
Admission: EM | Admit: 2017-09-02 | Discharge: 2017-09-02 | Disposition: A | Discharge status: Home / Self Care | Payer: BLUE CROSS/BLUE SHIELD | Attending: Emergency Medicine | Admitting: Emergency Medicine

## 2017-09-02 DIAGNOSIS — R0789 Other chest pain: Secondary | ICD-10-CM | POA: Diagnosis not present

## 2017-09-02 DIAGNOSIS — Z79899 Other long term (current) drug therapy: Secondary | ICD-10-CM | POA: Diagnosis not present

## 2017-09-02 DIAGNOSIS — R079 Chest pain, unspecified: Secondary | ICD-10-CM | POA: Diagnosis present

## 2017-09-02 HISTORY — DX: Migraine, unspecified, not intractable, without status migrainosus: G43.909

## 2017-09-02 LAB — BASIC METABOLIC PANEL
ANION GAP: 5 (ref 5–15)
BUN: 18 mg/dL (ref 6–20)
CALCIUM: 8.9 mg/dL (ref 8.9–10.3)
CO2: 27 mmol/L (ref 22–32)
CREATININE: 0.82 mg/dL (ref 0.44–1.00)
Chloride: 107 mmol/L (ref 101–111)
GFR calc Af Amer: >60 mL/min (ref >60)
GLUCOSE: 92 mg/dL (ref 65–99)
Potassium: 3.6 mmol/L (ref 3.5–5.1)
Sodium: 139 mmol/L (ref 135–145)

## 2017-09-02 LAB — CBC
HCT: 36.3 % (ref 35.0–47.0)
HEMOGLOBIN: 11.4 g/dL — AB (ref 12.0–16.0)
MCH: 19.9 pg — AB (ref 26.0–34.0)
MCHC: 31.3 g/dL — AB (ref 32.0–36.0)
MCV: 63.6 fL — ABNORMAL LOW (ref 80.0–100.0)
PLATELETS: 321 10*3/uL (ref 150–440)
RBC: 5.7 MIL/uL — ABNORMAL HIGH (ref 3.80–5.20)
RDW: 17.2 % — AB (ref 11.5–14.5)
WBC: 6.6 10*3/uL (ref 3.6–11.0)

## 2017-09-02 LAB — TROPONIN I

## 2017-09-02 NOTE — ED Triage Notes (Signed)
Pt reports last week started with some CP in the center of her chest and a little SOB when she laid down flat. Pt reports pain was intermittent but is now consistent. Pt reports some tightness in her right shoulder as well. Pt reports pain in chest is like pressure.

## 2017-09-02 NOTE — Discharge Instructions (Signed)
Fortunately today your blood work, your EKG, and your chest x-ray were reassuring.  Please make an appointment to reestablish care with your primary care physician within the next week for reevaluation return to the emergency department sooner for any concerns whatsoever.  It was a pleasure to take care of you today, and thank you for coming to our emergency department.  If you have any questions or concerns before leaving please ask the nurse to grab me and I'm more than happy to go through your aftercare instructions again.  If you were prescribed any opioid pain medication today such as Norco, Vicodin, Percocet, morphine, hydrocodone, or oxycodone please make sure you do not drive when you are taking this medication as it can alter your ability to drive safely.  If you have any concerns once you are home that you are not improving or are in fact getting worse before you can make it to your follow-up appointment, please do not hesitate to call 911 and come back for further evaluation.  Merrily BrittleNeil Rafay Dahan, MD  Results for orders placed or performed during the hospital encounter of 09/02/17  Basic metabolic panel  Result Value Ref Range   Sodium 139 135 - 145 mmol/L   Potassium 3.6 3.5 - 5.1 mmol/L   Chloride 107 101 - 111 mmol/L   CO2 27 22 - 32 mmol/L   Glucose, Bld 92 65 - 99 mg/dL   BUN 18 6 - 20 mg/dL   Creatinine, Ser 1.470.82 0.44 - 1.00 mg/dL   Calcium 8.9 8.9 - 82.910.3 mg/dL   GFR calc non Af Amer >60 >60 mL/min   GFR calc Af Amer >60 >60 mL/min   Anion gap 5 5 - 15  CBC  Result Value Ref Range   WBC 6.6 3.6 - 11.0 K/uL   RBC 5.70 (H) 3.80 - 5.20 MIL/uL   Hemoglobin 11.4 (L) 12.0 - 16.0 g/dL   HCT 56.236.3 13.035.0 - 86.547.0 %   MCV 63.6 (L) 80.0 - 100.0 fL   MCH 19.9 (L) 26.0 - 34.0 pg   MCHC 31.3 (L) 32.0 - 36.0 g/dL   RDW 78.417.2 (H) 69.611.5 - 29.514.5 %   Platelets 321 150 - 440 K/uL  Troponin I  Result Value Ref Range   Troponin I <0.03 <0.03 ng/mL   Dg Chest 2 View  Result Date:  09/02/2017 CLINICAL DATA:  Chest pain and shortness of breath. EXAM: CHEST  2 VIEW COMPARISON:  Thoracic radiographs dated 02/09/2016 FINDINGS: The heart size and mediastinal contours are within normal limits. Both lungs are clear. The visualized skeletal structures are unremarkable. IMPRESSION: Normal exam. Electronically Signed   By: Francene BoyersJames  Maxwell M.D.   On: 09/02/2017 10:53

## 2017-09-02 NOTE — ED Provider Notes (Signed)
[facility] Emergency Department Provider Note  ____________________________________________   First MD Initiated Contact with Patient 09/02/17 1254     (approximate)  I have reviewed the triage vital signs and the nursing notes.   HISTORY  Chief Complaint Chest Pain and Shoulder Pain   HPI Holly Mason is a 42 y.o. female who self presents to the emergency department with roughly 6 weeks of intermittent chest pain.  The pain is sharp and aching substernal nonradiating and associated with some shortness of breath.  No nausea or diaphoresis.  The pain seems to be worse at night.  Seems to be somewhat worse with deep inspiration.  She also reports some intermittent right shoulder pain.  The pain is nonexertional.  No fevers or chills.  No hemoptysis.  No leg swelling.  No recent surgery travel or immobilization.  The pain is not ripping or tearing does not go to her back.  She denies abdominal pain.  Past Medical History:  Diagnosis Date  . Migraine   . Migraines   . Vertigo     There are no active problems to display for this patient.   No past surgical history on file.  Prior to Admission medications   Medication Sig Start Date End Date Taking? Authorizing Provider  brompheniramine-pseudoephedrine-DM 30-2-10 MG/5ML syrup Take 5 mLs by mouth 4 (four) times daily as needed. 08/19/17   Joni Reining, PA-C  fexofenadine-pseudoephedrine (ALLEGRA-D) 60-120 MG 12 hr tablet Take 1 tablet by mouth 2 (two) times daily. 05/29/17   Joni Reining, PA-C  ibuprofen (ADVIL,MOTRIN) 800 MG tablet Take 1 tablet (800 mg total) by mouth every 8 (eight) hours as needed for moderate pain. 08/19/17   Joni Reining, PA-C  lidocaine (LIDODERM) 5 % Place 1 patch onto the skin every 12 (twelve) hours. Remove & Discard patch within 12 hours or as directed by MD 02/09/16   Cuthriell, Delorise Royals, PA-C  meloxicam (MOBIC) 15 MG tablet Take 1 tablet (15 mg total) by mouth  daily. 02/09/16   Cuthriell, Delorise Royals, PA-C  methocarbamol (ROBAXIN) 500 MG tablet Take 1 tablet (500 mg total) by mouth 4 (four) times daily. 01/19/16   Cuthriell, Delorise Royals, PA-C  naproxen (NAPROSYN) 500 MG tablet Take 1 tablet (500 mg total) by mouth 2 (two) times daily with a meal. 05/29/17   Joni Reining, PA-C    Allergies Patient has no known allergies.  No family history on file.  Social History Social History   Tobacco Use  . Smoking status: Never Smoker  . Smokeless tobacco: Never Used  Substance Use Topics  . Alcohol use: Yes    Comment: occ  . Drug use: No    Review of Systems Constitutional: No fever/chills Eyes: No visual changes. ENT: No sore throat. Cardiovascular: Positive for chest pain. Respiratory: Positive for shortness of breath. Gastrointestinal: No abdominal pain.  No nausea, no vomiting.  No diarrhea.  No constipation. Genitourinary: Negative for dysuria. Musculoskeletal: Negative for back pain. Skin: Negative for rash. Neurological: Negative for headaches, focal weakness or numbness.   ____________________________________________   PHYSICAL EXAM:  VITAL SIGNS: ED Triage Vitals  Enc Vitals Group     BP 09/02/17 0953 135/85     Pulse Rate 09/02/17 0953 79     Resp 09/02/17 0953 20     Temp 09/02/17 0953 98.4 F (36.9 C)     Temp Source 09/02/17 0953 Oral     SpO2 09/02/17 0953 100 %  Weight 09/02/17 0954 194 lb (88 kg)     Height 09/02/17 0954 5\' 4"  (1.626 m)     Head Circumference --      Peak Flow --      Pain Score 09/02/17 0953 5     Pain Loc --      Pain Edu? --      Excl. in GC? --     Constitutional: Alert and oriented x4 well-appearing nontoxic no diaphoresis speaks in full clear sentences Eyes: PERRL EOMI. Head: Atraumatic. Nose: No congestion/rhinnorhea. Mouth/Throat: No trismus Neck: No stridor.  Able lie completely flat no JVD Cardiovascular: Normal rate, regular rhythm. Grossly normal heart sounds.  Good  peripheral circulation. Respiratory: Normal respiratory effort.  No retractions. Lungs CTAB and moving good air Gastrointestinal: Soft nondistended nontender no rebound or guarding no peritonitis Musculoskeletal: No lower extremity edema legs are equal in size Neurologic:  Normal speech and language. No gross focal neurologic deficits are appreciated. Skin:  Skin is warm, dry and intact. No rash noted. Psychiatric: Mood and affect are normal. Speech and behavior are normal.    ____________________________________________   DIFFERENTIAL includes but not limited to  Acute coronary syndrome, pericarditis, myocarditis, pulmonary embolism ____________________________________________   LABS (all labs ordered are listed, but only abnormal results are displayed)  Labs Reviewed  CBC - Abnormal; Notable for the following components:      Result Value   RBC 5.70 (*)    Hemoglobin 11.4 (*)    MCV 63.6 (*)    MCH 19.9 (*)    MCHC 31.3 (*)    RDW 17.2 (*)    All other components within normal limits  BASIC METABOLIC PANEL  TROPONIN I  POC URINE PREG, ED    Lab work reviewed by me with no acute disease __________________________________________  EKG  ED ECG REPORT I, Merrily BrittleNeil Dorsie Burich, the attending physician, personally viewed and interpreted this ECG.  Date: 09/02/2017 EKG Time:  Rate: 76 Rhythm: normal sinus rhythm QRS Axis: normal Intervals: normal ST/T Wave abnormalities: normal Narrative Interpretation: no evidence of acute ischemia  ____________________________________________  RADIOLOGY  Chest x-ray reviewed by me with no acute disease ____________________________________________   PROCEDURES  Procedure(s) performed: no  Procedures  Critical Care performed: no  Observation: no ____________________________________________   INITIAL IMPRESSION / ASSESSMENT AND PLAN / ED COURSE  Pertinent labs & imaging results that were available during my care of the  patient were reviewed by me and considered in my medical decision making (see chart for details).  Patient is very well-appearing with atypical chest pain.  She is PERC negative.  I had a lengthy discussion with the patient regarding the diagnostic uncertainty however I was comfortable having her go home and follow-up with primary care.  Her symptoms are improved with nonsteroidals with encouraged her to continue taking Aleve at home.  Strict return precautions have been given and the patient verbalizes understanding and agreement with the plan.      ____________________________________________   FINAL CLINICAL IMPRESSION(S) / ED DIAGNOSES  Final diagnoses:  Atypical chest pain      NEW MEDICATIONS STARTED DURING THIS VISIT:  New Prescriptions   No medications on file     Note:  This document was prepared using Dragon voice recognition software and may include unintentional dictation errors.     Merrily Brittleifenbark, Lissandro Dilorenzo, MD 09/02/17 1318

## 2018-09-12 ENCOUNTER — Emergency Department
Admission: EM | Admit: 2018-09-12 | Discharge: 2018-09-12 | Disposition: A | Discharge status: Home / Self Care | Payer: BLUE CROSS/BLUE SHIELD | Attending: Emergency Medicine | Admitting: Emergency Medicine

## 2018-09-12 DIAGNOSIS — R05 Cough: Secondary | ICD-10-CM | POA: Diagnosis present

## 2018-09-12 DIAGNOSIS — J101 Influenza due to other identified influenza virus with other respiratory manifestations: Secondary | ICD-10-CM | POA: Diagnosis not present

## 2018-09-12 DIAGNOSIS — Z79899 Other long term (current) drug therapy: Secondary | ICD-10-CM | POA: Insufficient documentation

## 2018-09-12 LAB — INFLUENZA PANEL BY PCR (TYPE A & B)
INFLAPCR: POSITIVE — AB
INFLBPCR: NEGATIVE

## 2018-09-12 MED ORDER — ACETAMINOPHEN 325 MG PO TABS
650.0000 mg | ORAL_TABLET | Freq: Once | ORAL | Status: AC | PRN
  Administered 2018-09-12: 650 mg via ORAL
  Filled 2018-09-12: qty 2

## 2018-09-12 MED ORDER — OSELTAMIVIR PHOSPHATE 75 MG PO CAPS: 75.0000 mg | ORAL_CAPSULE | Freq: Two times a day (BID) | ORAL | 0 refills | Status: AC

## 2018-09-12 NOTE — ED Triage Notes (Signed)
Pt presents c/o cough, headache, body aches, chest congestion, and fever. No meds PTA per pt report.

## 2018-09-12 NOTE — Discharge Instructions (Signed)
You have been diagnosed with the flu.  I have given you prescription for Tamiflu to take twice daily for the next 5 days.  You can take ibuprofen and Tylenol as needed for fever and body aches.  Get some rest and drink plenty of fluids.  No work for 1 week.

## 2018-09-12 NOTE — ED Provider Notes (Signed)
[facility] Emergency Department Provider Note ____________________________________________  Time seen: 1140  I have reviewed the triage vital signs and the nursing notes.  HISTORY  Chief Complaint  Cough and Fever   HPI Holly Mason is a 43 y.o. female presents to the ER today with complaint of headache, cough and chest congestion.  She reports this started abruptly yesterday evening.  The headache is generalized.  She describes the pain as throbbing.  She denies visual changes or dizziness.  The cough is nonproductive.  She denies runny nose, nasal congestion, ear pain, sore throat or shortness of breath.  She reports fever, chills and body aches.  She has not taken anything for this over-the-counter.  She has no history of asthma or COPD.  She does not smoke.  She has not had sick contacts.  She did not get her flu shot.  Past Medical History:  Diagnosis Date  . Migraine   . Migraines   . Vertigo     There are no active problems to display for this patient.   No past surgical history on file.  Prior to Admission medications   Medication Sig Start Date End Date Taking? Authorizing Provider  brompheniramine-pseudoephedrine-DM 30-2-10 MG/5ML syrup Take 5 mLs by mouth 4 (four) times daily as needed. 08/19/17   Joni Reining, PA-C  fexofenadine-pseudoephedrine (ALLEGRA-D) 60-120 MG 12 hr tablet Take 1 tablet by mouth 2 (two) times daily. 05/29/17   Joni Reining, PA-C  ibuprofen (ADVIL,MOTRIN) 800 MG tablet Take 1 tablet (800 mg total) by mouth every 8 (eight) hours as needed for moderate pain. 08/19/17   Joni Reining, PA-C  lidocaine (LIDODERM) 5 % Place 1 patch onto the skin every 12 (twelve) hours. Remove & Discard patch within 12 hours or as directed by MD 02/09/16   Cuthriell, Delorise Royals, PA-C  meloxicam (MOBIC) 15 MG tablet Take 1 tablet (15 mg total) by mouth daily. 02/09/16   Cuthriell, Delorise Royals, PA-C  methocarbamol (ROBAXIN) 500 MG tablet  Take 1 tablet (500 mg total) by mouth 4 (four) times daily. 01/19/16   Cuthriell, Delorise Royals, PA-C  naproxen (NAPROSYN) 500 MG tablet Take 1 tablet (500 mg total) by mouth 2 (two) times daily with a meal. 05/29/17   Joni Reining, PA-C  oseltamivir (TAMIFLU) 75 MG capsule Take 1 capsule (75 mg total) by mouth 2 (two) times daily for 5 days. 09/12/18 09/17/18  Lorre Munroe, NP    Allergies Patient has no known allergies.  No family history on file.  Social History Social History   Tobacco Use  . Smoking status: Never Smoker  . Smokeless tobacco: Never Used  Substance Use Topics  . Alcohol use: Yes    Comment: occ  . Drug use: No    Review of Systems  Constitutional: Positive for fever, chills and body aches. Eyes: Negative for visual changes. ENT: Negative for ear pain, runny nose, nasal congestion or sore throat. Cardiovascular: Negative for chest pain. Respiratory: Positive for cough and chest congestion.  Negative for shortness of breath. Gastrointestinal: Negative for abdominal pain, nausea, vomiting and diarrhea. Skin: Negative for rash. Neurological: Positive for headache negative for  focal weakness, tingling or numbness. ____________________________________________  PHYSICAL EXAM:  VITAL SIGNS: ED Triage Vitals  Enc Vitals Group     BP 09/12/18 1049 122/60     Pulse Rate 09/12/18 1049 (!) 107     Resp 09/12/18 1049 16     Temp 09/12/18 1049 (!)  101.4 F (38.6 C)     Temp Source 09/12/18 1049 Oral     SpO2 09/12/18 1049 97 %     Weight 09/12/18 1050 196 lb (88.9 kg)     Height 09/12/18 1050 5\' 5"  (1.651 m)     Head Circumference --      Peak Flow --      Pain Score 09/12/18 1050 10     Pain Loc --      Pain Edu? --      Excl. in GC? --     Constitutional: Alert and oriented.  Ill-appearing but in no distress. Head: Normocephalic without sinus tenderness. Eyes: Conjunctivae are normal. PERRL. Normal extraocular movements Ears: Canals clear. TMs intact  bilaterally.  Positive serous effusion on the right Nose: Mucosa boggy and moist.  Turbinates swollen. Mouth/Throat: Mucous membranes are moist.  No posterior pharynx erythema or exudate noted Hematological/Lymphatic/Immunological: No cervical lymphadenopathy. Cardiovascular: Tachycardic, regular rhythm. Respiratory: Normal respiratory effort. No wheezes/rales/rhonchi. Gastrointestinal: Soft and nontender. No distention. Neurologic:  Normal speech and language. No gross focal neurologic deficits are appreciated. Skin: Skin is clammy.  ____________________________________________   LABS   Lab Orders     Influenza panel by PCR (type A & B)  ____________________________________________ ____________________________________________  INITIAL IMPRESSION / ASSESSMENT AND PLAN / ED COURSE  Headache, Cough, Chest Congestion, Fever, Chills and Body Aches:  DDX include viral URI with cough, influenza, acute bronchitis, CAP Rapid flu positive for A RX for Tamiflu 75 mg BID x 5 days Tylenol and Ibuprofen as needed for chills and body aches Get some rest and drink plenty of fluids Work note provided ____________________________________________  FINAL CLINICAL IMPRESSION(S) / ED DIAGNOSES  Final diagnoses:  Influenza A   Nicki Reaper, NP    Lorre Munroe, NP 09/12/18 1149    Nita Sickle, MD 09/12/18 [phone]

## 2018-10-03 IMAGING — CR DG CHEST 2V
2 series · 2 of 2 positions shown · non-contrast
Comparison: Thoracic radiographs dated 02/09/2016

CLINICAL DATA: Chest pain and shortness of breath.

EXAM:
CHEST  2 VIEW

[chest pa]
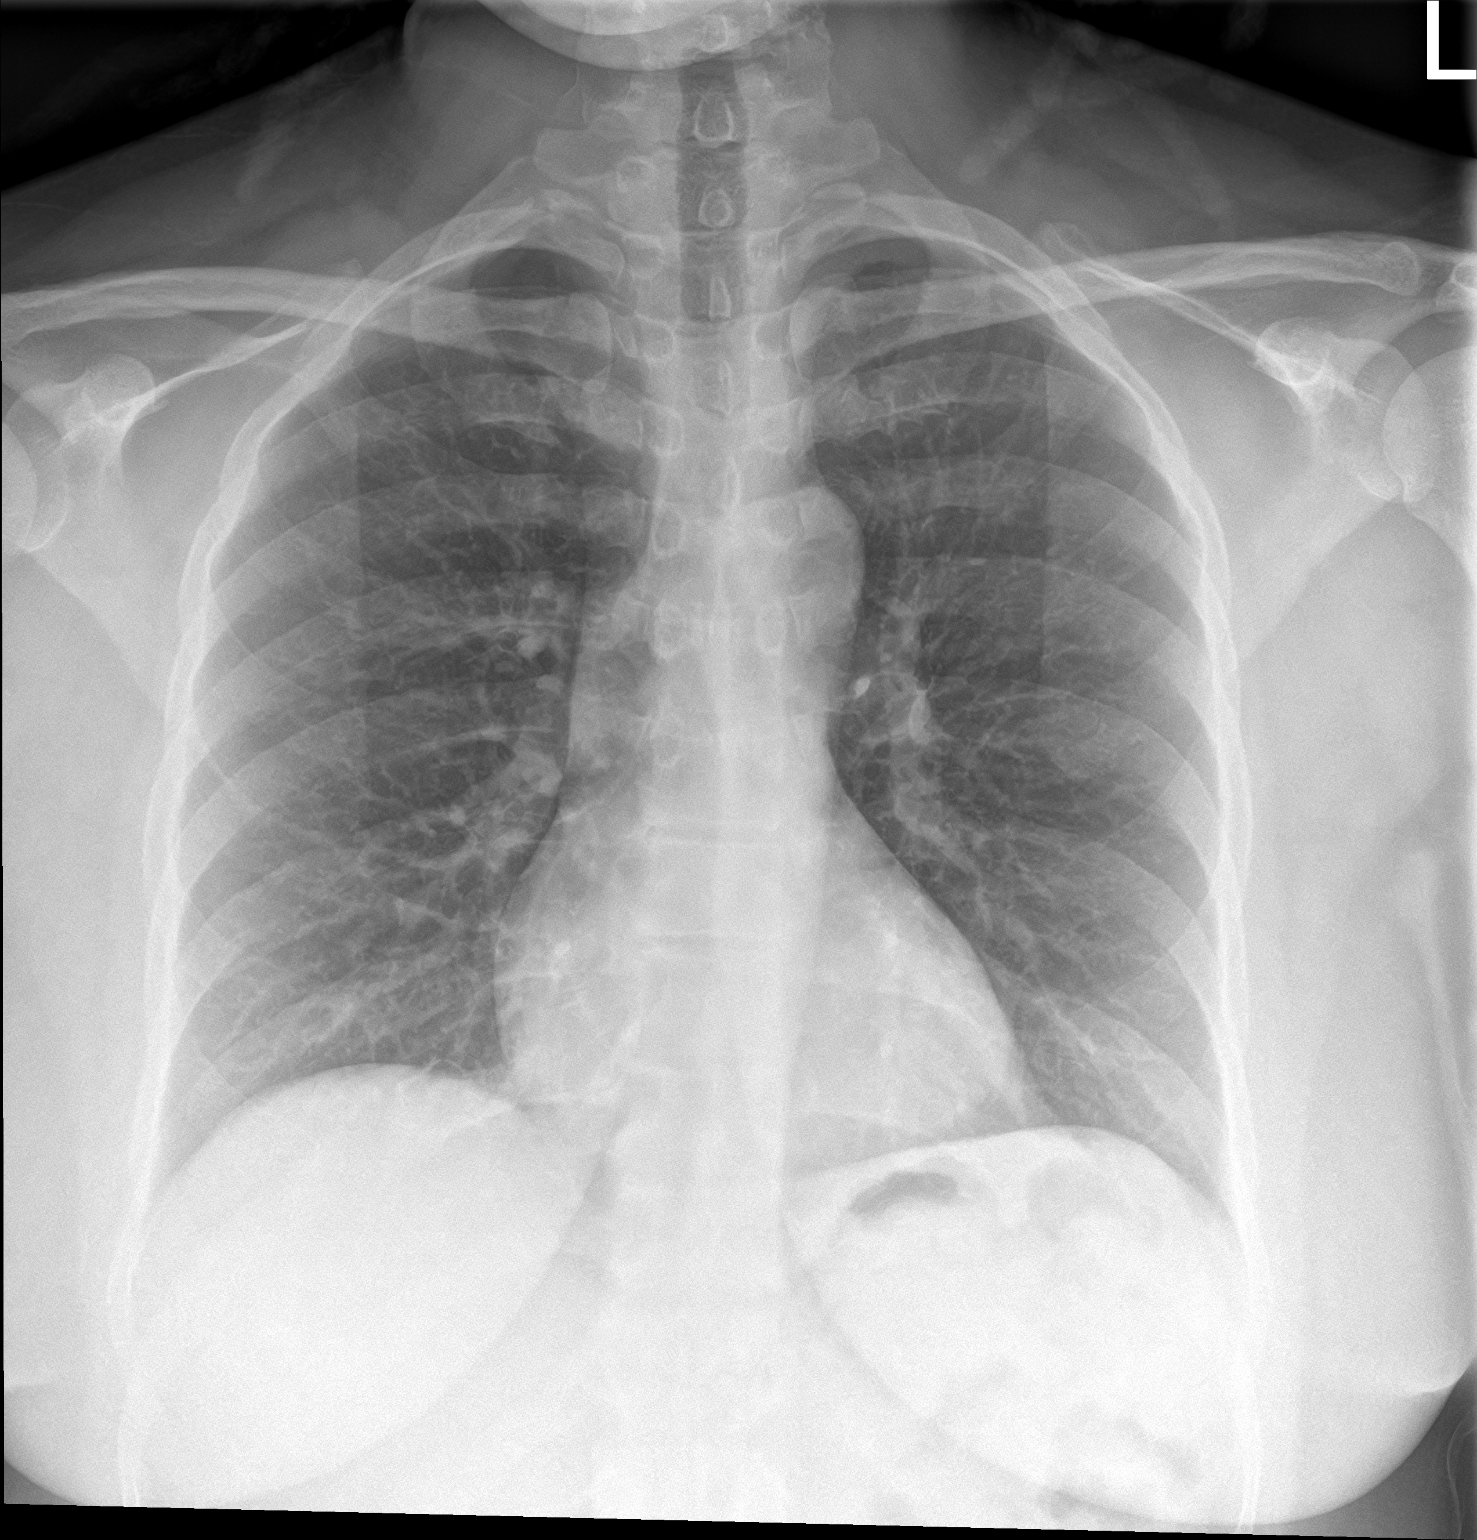

[chest lat]
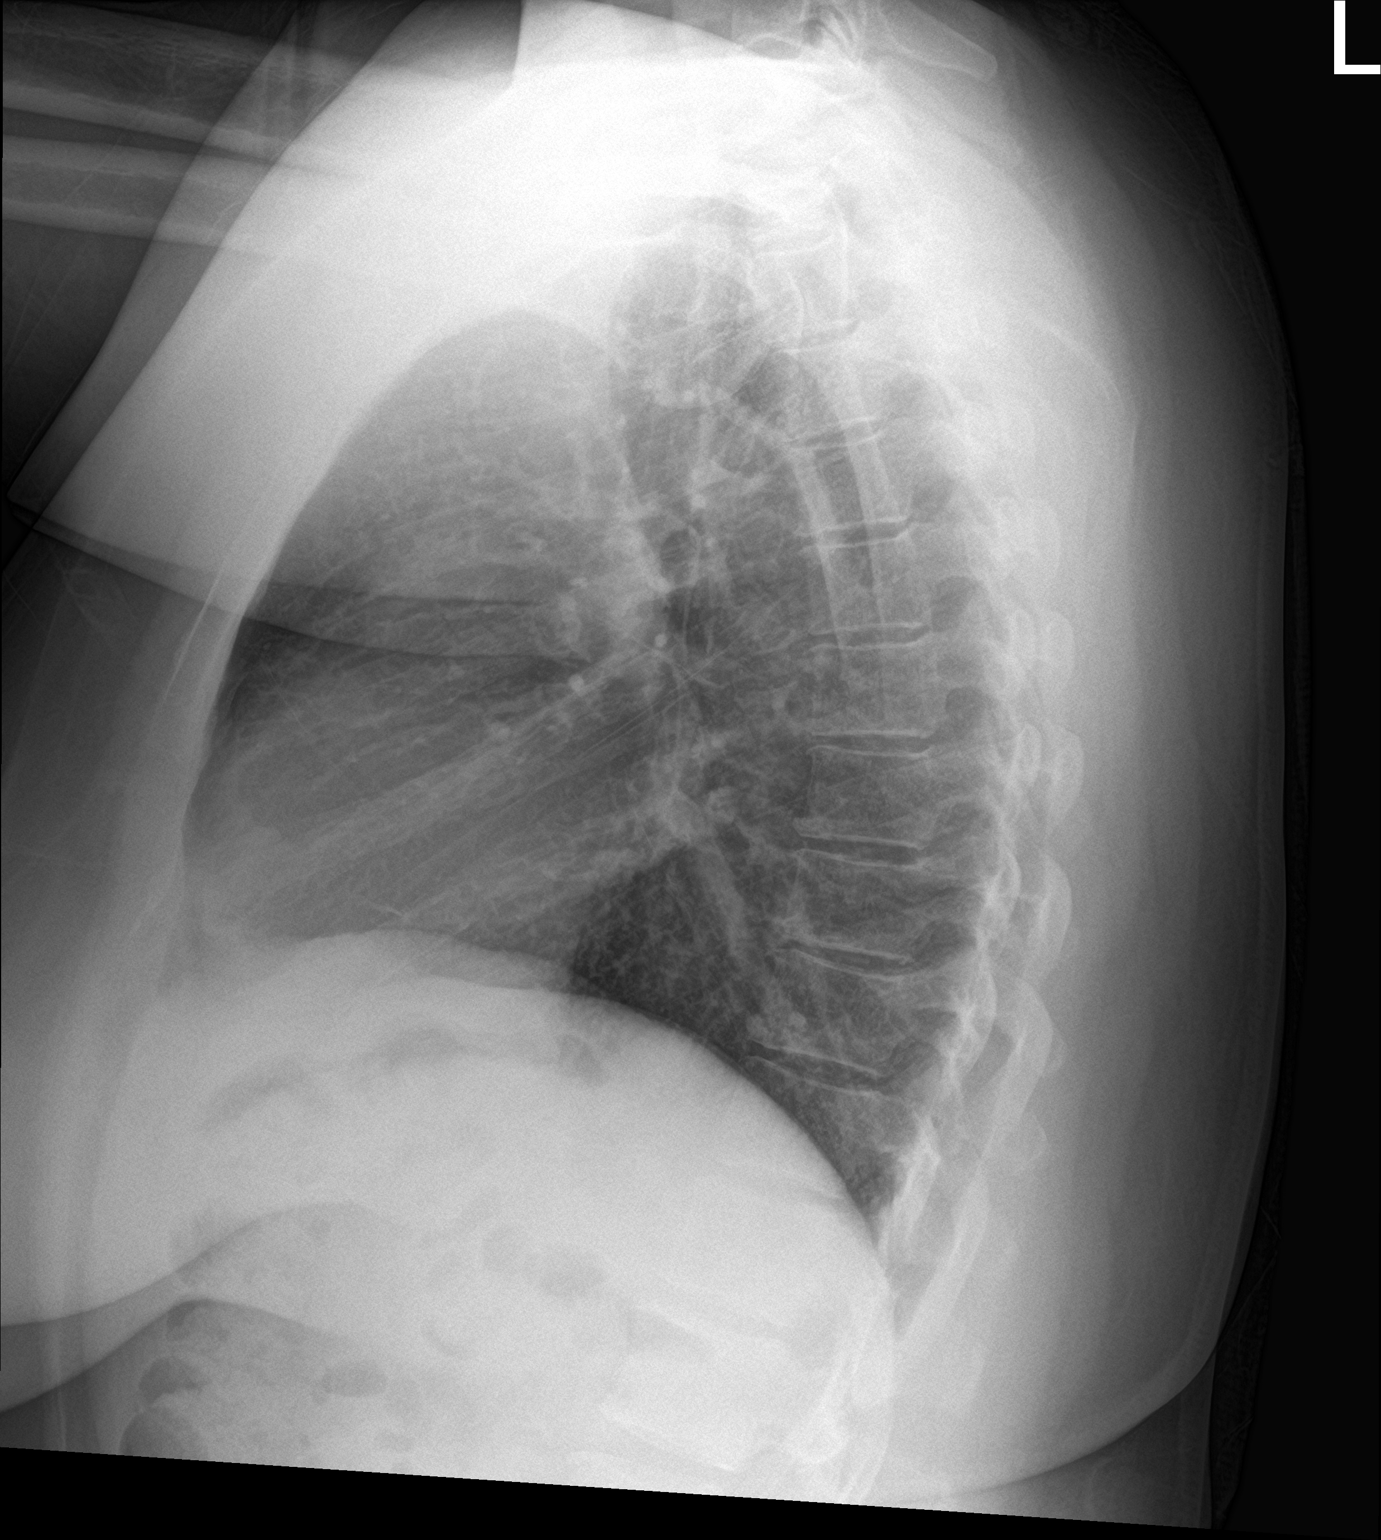

[2 of 2 positions shown; findings below may reference images not displayed]

FINDINGS: The heart size and mediastinal contours are within normal limits.
Both lungs are clear. The visualized skeletal structures are
unremarkable.
IMPRESSION: Normal exam.

## 2019-06-10 ENCOUNTER — Other Ambulatory Visit: Payer: Self-pay | Admitting: Certified Nurse Midwife

## 2019-06-10 DIAGNOSIS — Z1231 Encounter for screening mammogram for malignant neoplasm of breast: Secondary | ICD-10-CM
